# Patient Record
Sex: Male | Born: 1990 | Race: Black or African American | Hispanic: No | Marital: Single | Smoking: Never smoker
Health system: Southern US, Community
[De-identification: ages and names within clinical notes are randomized; demographics above are authoritative.]

## PROBLEM LIST (undated history)

## (undated) DIAGNOSIS — F909 Attention-deficit hyperactivity disorder, unspecified type: Secondary | ICD-10-CM

## (undated) HISTORY — PX: ANKLE SURGERY: SHX546

## (undated) HISTORY — PX: OTHER SURGICAL HISTORY: SHX169

## (undated) HISTORY — DX: Attention-deficit hyperactivity disorder, unspecified type: F90.9

---
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
## 2009-03-22 ENCOUNTER — Emergency Department (HOSPITAL_COMMUNITY): Admission: EM | Admit: 2009-03-22 | Discharge: 2009-03-22 | Payer: Self-pay | Admitting: Family Medicine

## 2009-12-22 ENCOUNTER — Emergency Department (HOSPITAL_COMMUNITY)
Admission: EM | Admit: 2009-12-22 | Discharge: 2009-12-22 | Payer: Self-pay | Source: Home / Self Care | Admitting: Emergency Medicine

## 2010-01-11 ENCOUNTER — Encounter: Admission: RE | Admit: 2010-01-11 | Discharge: 2010-01-11 | Payer: Self-pay | Admitting: Internal Medicine

## 2010-03-18 ENCOUNTER — Emergency Department (HOSPITAL_COMMUNITY)
Admission: EM | Admit: 2010-03-18 | Discharge: 2010-03-19 | Disposition: A | Discharge status: Home / Self Care | Payer: BC Managed Care – PPO | Attending: Emergency Medicine | Admitting: Emergency Medicine

## 2010-03-18 DIAGNOSIS — F909 Attention-deficit hyperactivity disorder, unspecified type: Secondary | ICD-10-CM | POA: Insufficient documentation

## 2010-03-18 DIAGNOSIS — R059 Cough, unspecified: Secondary | ICD-10-CM | POA: Insufficient documentation

## 2010-03-18 DIAGNOSIS — J40 Bronchitis, not specified as acute or chronic: Secondary | ICD-10-CM | POA: Insufficient documentation

## 2010-03-18 DIAGNOSIS — J45909 Unspecified asthma, uncomplicated: Secondary | ICD-10-CM | POA: Insufficient documentation

## 2010-03-18 DIAGNOSIS — R05 Cough: Secondary | ICD-10-CM | POA: Insufficient documentation

## 2010-03-18 DIAGNOSIS — J3489 Other specified disorders of nose and nasal sinuses: Secondary | ICD-10-CM | POA: Insufficient documentation

## 2010-03-18 DIAGNOSIS — R079 Chest pain, unspecified: Secondary | ICD-10-CM | POA: Insufficient documentation

## 2010-03-19 ENCOUNTER — Emergency Department (HOSPITAL_COMMUNITY): Payer: BC Managed Care – PPO

## 2010-05-21 LAB — POCT HEMOGLOBIN-HEMACUE: Hemoglobin: 15.4 g/dL (ref 13.0–17.0)

## 2010-06-27 NOTE — Op Note (Signed)
Danny Robbins, Danny Robbins         ACCOUNT NO.:  [id number]   MEDICAL RECORD NO.:  [id number]          PATIENT TYPE:  AMB   LOCATION:  DSC                          FACILITY:  [facility]   PHYSICIAN:  Lubertha Basque. Dalldorf, M.D.DATE OF BIRTH:  11/09/1990   DATE OF PROCEDURE:  10/28/2007  DATE OF DISCHARGE:                               OPERATIVE REPORT   PREOPERATIVE DIAGNOSES:  1. Right anterior cruciate ligament tear.  2. Right torn lateral meniscus.  3. Right osteochondral injury.   POSTOPERATIVE DIAGNOSES:  1. Right anterior cruciate ligament tear.  2. Right torn lateral meniscus.  3. Right osteochondral injury.   PROCEDURES:  1. Right knee anterior cruciate ligament reconstruction.  2. Right knee microfracture, lateral femoral condyle.  3. Right knee partial lateral meniscectomy.  4. Right knee arthroscopic chondroplasty, patellofemoral.   ANESTHESIA:  General and block.   ATTENDING SURGEON:  Lubertha Basque. 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  He suffered ACL, MCL injuries as well as osteochondral injury, and torn  meniscus based on exam and MRI.  We braced him for 3 weeks to allow for  some of the MCL to heal.  This has been accomplished and he is now  offered ACL reconstruction and other associated procedures in hopes of  stabilizing his knee and allowing him to get back to sports such as  football.  Informed operative consent was obtained after discussion of  possible complications of reaction to anesthesia, infection, and knee  stiffness.  The patient also understood about the importance of the  postoperative rehabilitation protocol to optimize his result.   SUMMARY/FINDINGS AND PROCEDURE:  Under general anesthesia and a block,  an arthroscopy of the right knee was performed.  Suprapatellar pouch was  benign while he had some  mild chondromalacia patella, addressed with a  brief chondroplasty.  The patella appeared to track well.  The medial  compartment exhibited no evidence of meniscal or articular cartilage  injury.  The ACL was completely torn while the underlying PCL was  intact.  Lateral compartment exhibited a small radial meniscal tear  addressed with a brief partial lateral meniscectomy removing just a tiny  portion of this structure.  More significance was a quarter-sized area  of bare bone on the lateral femoral condyle, which came into  articulation at full extension.  This was consistent with the  osteochondral injury seen on MRI.  I performed a chondroplasty and then  microfracture in six spots with awls to create punctate bleeding in  hopes of fibrocartilage forming.  We then reconstructed the ACL with  middle third patellar tendon autograft, stabilized at both ends with  metal Linvatec screws.  Lindwood Qua assisted throughout and was  invaluable to the completion of the case in that he helped position and  retract while I performed the procedure.  He also fashioned the  autograft on the back table while I performed arthroscopic portions of  the case thereby significantly minimizing OR time.  DESCRIPTION OF THE PROCEDURE:  The patient was taken to operating suite  where general anesthetic was applied without difficulty.  He was also  given a block in the preanesthesia area.  He was Positioned supine,  prepped and draped in normal sterile fashion.  After administration of  IV Kefzol, an arthroscopy of the right knee was performed through a  total of 2 portals.  Findings were as noted above.  Procedures;  arthroscopy consisted of the chondroplasty and microfracture of lateral  along with a partial lateral meniscectomy.  I also performed a  chondroplasty, patellofemoral.  We then performed a notchplasty in a  conservative fashion with a bur, but did visualize over-the-top position  well.  I  removed his ACL stump and a portion of the fat pad.  The  underlying PCL was intact.  Arthroscopic equipment was then removed  temporarily for graft harvest.  We exsanguinated the leg and inflated  the tourniquet about the thigh.  A longitudinal medial incision was made  with dissection down through the peritenon to expose the patellar  tendon.  The middle third of the structure was harvested with contiguous  bone plugs from the patella and tibial tubercle.  This was done with  oscillating saw and scissors.  This tendon was not seen completely  normal and was very thickened consistent perhaps with chronic tendinosis  or scarring.  Nevertheless, it seemed to give [place] a very thick graft.  The soft tissue was actually thicker then the bone plugs.  This was then  fashioned on the back table by Lindwood Qua to assist through 9 and  10-mm tunnels with a drill hole placed in each of the bone plug.  A wire  was placed in one bone plug and a PDS suture in the other.  Arthroscopic  limb was reintroduced while graft was being prepared.  I placed a guide  anterior to the PCL and utilized this to place a guidewire up into the  knee into this position.  I overreamed to a diameter of 11 mm.  A  transtibial guide was placed in the over-the-top position and a separate  guidewire was placed out the proximal thigh.  I used this guidewire to  ream the distal femur to a depth of 2-1/2 cm in diameter of 10 mm with a  1 or 2 mm posterior wall well-visualized.  Bony debris was removed from  the knee with a shaver.  The aforementioned graft was then pulled into  position with some moderate difficulty.  This seated fully.  We were  careful to keep the tendinous aspect of the graft and a posterior  direction as I entered the femoral tunnel.  I placed the guidewire in an  anterior position in this tunnel and over this placed an 8 x 25 metal  Linvatec screw in interference fashion to secure the leading bone plug.   I then ranged the knee and the graft was felt to be isometric.  Easily  came to full extension.  I placed a second guidewire up into the knee,  seem to enter arthroscopically through the tibial tunnel.  Over this, I  placed a 7 x 25 Linvatec interference screw.  Again, the knee ranged  full and the graft was taut throughout.  Arthroscopic equipment was  removed.  Some bony trimmings were placed into the patellar defect as  well as the tibial tubercle defect.  The peritenon was reapproximated  with 0 Vicryl in a running fashion.  The tourniquet was deflated and a  small amount of bleeding was easily controlled with Bovie cautery.  Subcutaneous tissues reapproximated with 2-0 undyed Vicryl and skin was  closed with nylon.  Adaptic was applied followed by dry gauze and loose  Ace wrap.  Estimated blood loss and intraoperative fluids were obtained  from anesthesia records as can accurate tourniquet time.   DISPOSITION:  The patient was extubated in the operating room and was  taken to recovery room in stable addition.  He was to be admitted for  pain control.  We will probably discharge home in the morning.      Lubertha Basque Jerl Santos, M.D.  Electronically Signed     PGD/MEDQ  D:  10/28/2007  T:  10/29/2007  Job:  [id number]

## 2010-06-27 NOTE — Op Note (Signed)
Danny Robbins, Danny Robbins         ACCOUNT NO.:  [id number]   MEDICAL RECORD NO.:  [id number]          PATIENT TYPE:  AMB   LOCATION:  DSC                          FACILITY:  [facility]   PHYSICIAN:  Lubertha Basque. Dalldorf, M.D.DATE OF BIRTH:  20-Apr-1990   DATE OF PROCEDURE:  09/07/2008  DATE OF DISCHARGE:                               OPERATIVE REPORT   PREOPERATIVE DIAGNOSIS:  Right knee torn lateral meniscus.   POSTOPERATIVE DIAGNOSIS:  Right knee torn lateral meniscus.   PROCEDURE:  Right knee partial lateral meniscectomy.   ANESTHESIA:  General.   ATTENDING SURGEON:  Lubertha Basque. 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  He is about 10 months from an ACL  reconstruction.  He was doing well until about 2 months back when he  developed some lateral aspect pain and started to lose some of his  strength and mobility.  By exam and MRI, he has a small lateral meniscus  tear, which seems to be holding him back.  He is offered an arthroscopy  at this point to address the meniscal injury.  Informed operative  consent was obtained after discussion of possible complications  including reaction to anesthesia and infection.   SUMMARY OF FINDINGS AND PROCEDURE:  Under general anesthesia, a right  knee arthroscopy was performed.  He had a nice tight knee during exam  under anesthesia.  The suprapatellar pouch was benign as was the  patellofemoral joint.  There was some mild grade 3 breakdown medial  addressed with a brief chondroplasty.  The medial compartment otherwise  showed an intact meniscus with minimal degenerative changes.  We did  find some intraarticular cartilagenou loose bodies, which were removed.  The ACL appeared intact and normal for a reconstruction.  In the lateral  compartment the area where we had performed microfracture, had covered  nicely with  fibrocartilage.  He had again some cartilaginous loose  bodies, which I removed.  He had a small radial tear of the middle horn  of the lateral meniscus consistent with his MRI and a small partial  lateral meniscectomy was done leaving him with healthy portion of his  middle and posterior horn.   DESCRIPTION OF PROCEDURE:  The patient was taken to operating suite  where general anesthetic was applied without difficulty.  He was  positioned supine and prepped and draped in normal sterile fashion.  After the administration of preop IV Kefzol, an arthroscopy of the right  knee was performed through a total of 2 old portals.  Findings were as  noted above and procedure consisted of the partial lateral meniscectomy  done with a small shaver.  We also removed the cartilaginous loose  bodies, which likely was causing a good degree of his trouble.  The knee  was thoroughly irrigated followed by placement of Adaptic over the  portals and dry gauze with a loose Ace wrap.  Estimated blood loss and  intraoperative fluids can be obtained from the anesthesia records.   DISPOSITION:  The patient was extubated in the operating room and taken  to recovery room in stable addition.  He is to go home same-day and  follow up in the office next week.  I will contact him by phone tonight.      Lubertha Basque Jerl Santos, M.D.  Electronically Signed     PGD/MEDQ  D:  09/07/2008  T:  09/07/2008  Job:  [id number]

## 2010-06-30 NOTE — Op Note (Signed)
NAMEJAQUANN, GUARISCO                   ACCOUNT NO.:  [id number]   MEDICAL RECORD NO.:  [id number]                   PATIENT TYPE:  AMB   LOCATION:  DSC                                  FACILITY:  [facility]   PHYSICIAN:  Harvie Junior, M.D.                DATE OF BIRTH:  11-22-1990   DATE OF PROCEDURE:  12/14/2002  DATE OF DISCHARGE:                                 OPERATIVE REPORT   PREOPERATIVE DIAGNOSIS:  Fracture of medial malleolus.   POSTOPERATIVE DIAGNOSIS:  Fracture of medial malleolus.   PROCEDURE:  A closed reduction and fixation of medial malleolar fracture  with cannulated screw fixation.   SURGEON:  Harvie Junior, M.D.   ASSISTANT:  Marshia Ly, P.A.   ANESTHESIA:  General.   INDICATIONS FOR PROCEDURE:  This 20 year old male with a long history of  having a skateboarding-type injury where he fell off a skateboard and  suffered a medial malleolar fracture.  He went to Urgent Care and we were  consulted for treatment of this injury.  X-rays showed that he had a large  medial malleolar fracture with what looked to be a shear component and  looked to be vertically unstable.  We talked about treatment options,  including a closed treatment and monitoring, versus what I thought would be  the more appropriate course of action, which would be some sort of fixation  to prevent vertical migration of the medial malleolus fracture.  The family  ultimately thought about options, and wanted to undergo an open reduction,  internal fixation.  He was brought to the operating room for this procedure.   DESCRIPTION OF PROCEDURE:  The patient is brought to the operating room, and  after adequate anesthesia was obtained the patient was placed on the  operating table.  The left leg was prepped and draped in the usual sterile  fashion.  Following this the leg was exsanguinated and the blood pressure  tourniquet was inflated to 300 mmHg.  Following this fluoroscopy machine was  used.  Rigorous range of motion of the ankle revealed that there was some  tendency toward widening in the fracture gap, but no tendency towards  vertical migration.  At that point it was felt that percutaneous fixation  may be amenable, as opposed to open reduction and internal fixation, and  this was undertaken.  At this point the guide wires were passed across the  fracture fragment.  Three cannulated screws were placed, one perpendicular  to the fracture and the other two as resisting vertical migration.  At this  point the screws were measured and put in place.  Fl uroscopy was used  throughout the case to ensure adequate screw length and placement.  At this  point, once the screws were all placed, fluoroscopy was again used to make  sure that there was excellent fixation.  The range of motion of the ankle  showed no tendency towards subluxation  at this point.  The wounds were  irrigated and suctioned dry.  They were closed with interrupted sutures.  A  sterile compressive dressing was applied, as well as the use of a posterior  splint. The patient was taken to the recovery room, and noted to be in  satisfactory condition.  The estimated blood loss for the procedure was  none.                                               Harvie Junior, M.D.   Ranae Plumber  D:  12/14/2002  T:  12/14/2002  Job:  [id number]

## 2011-05-12 IMAGING — CT CT NECK W/ CM
4 of 5 series · 16 of 33 positions shown, 19 images · IV contrast (75CC OMNI 300)
Comparison: Cervical CT 06/14/2005.

CLINICAL DATA: 19-year-old male with "knot felt on physical exam"
that the patient is not able to localize.  Query lymphadenopathy.

CT NECK WITH CONTRAST
TECHNIQUE: Multidetector CT imaging of the neck was performed with
intravenous contrast.
Contrast: 75 ml Kmnipaque-R99.

[Series 3: axial neck · axial · 0.38mm/px · z∈[+58,+168]mm · 3 of 111 slices shown]
[im 23/111  bone]
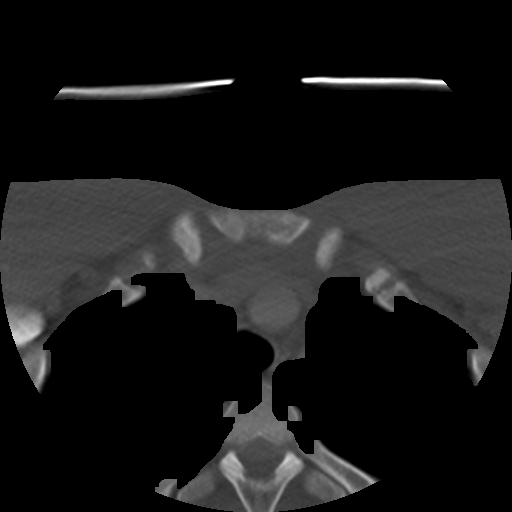
[im 45/111  bone]
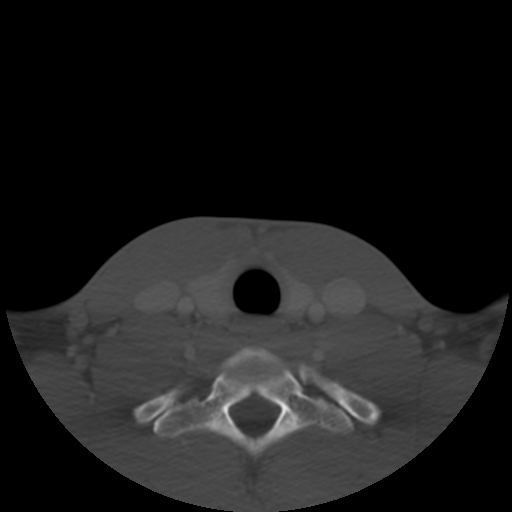
[im 67/111  bone]
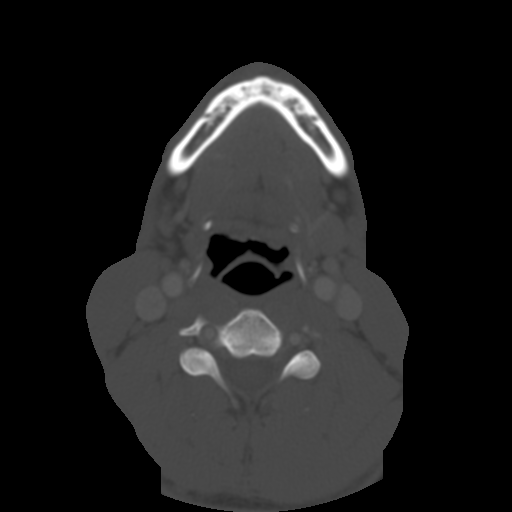

[Series 200: cor · coronal · 0.55mm/px · 3 of 94 slices shown]
[im 19/94  bone]
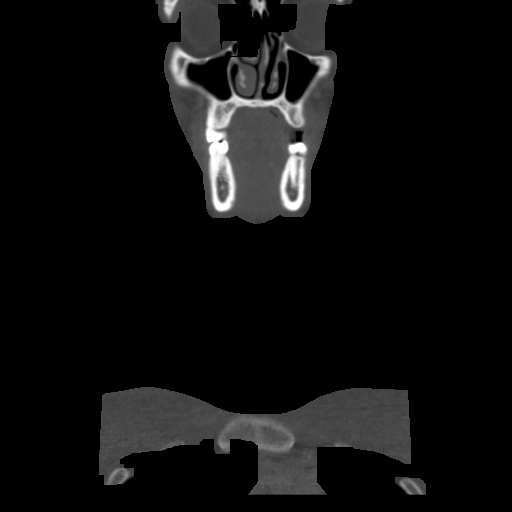
[im 38/94  bone]
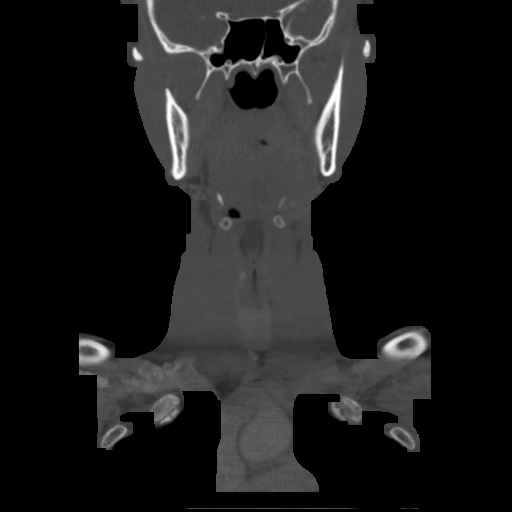
[im 56/94  bone]
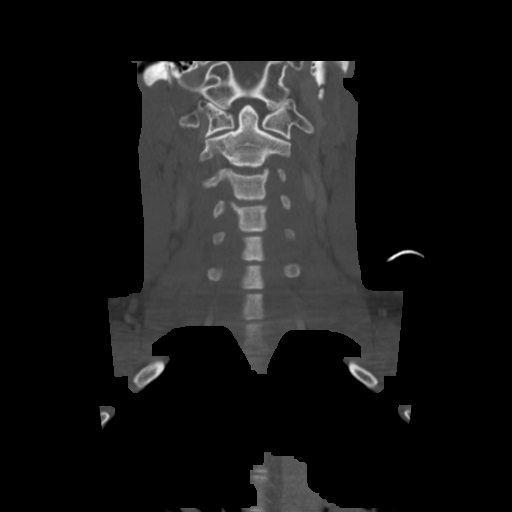

[Series 202: sag · sagittal · 0.55mm/px · 5 of 97 slices shown, 6 images]
[im 33/97  bone]
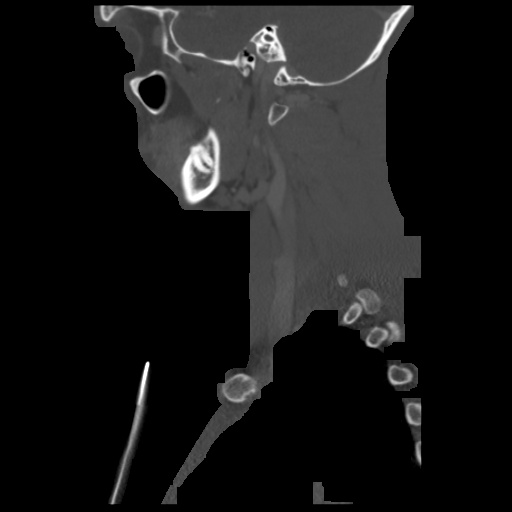
[im 41/97  bone]
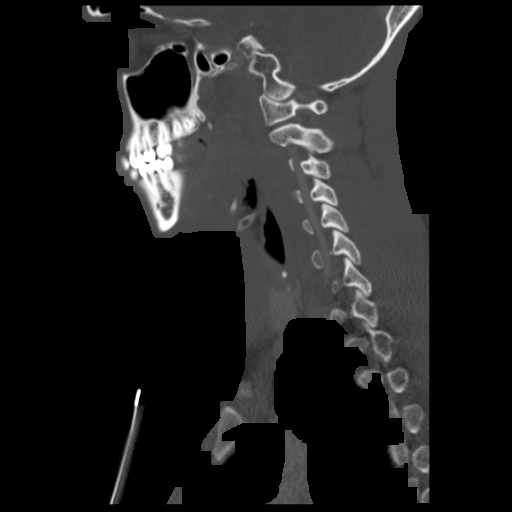
[im 49/97  soft-tissue]
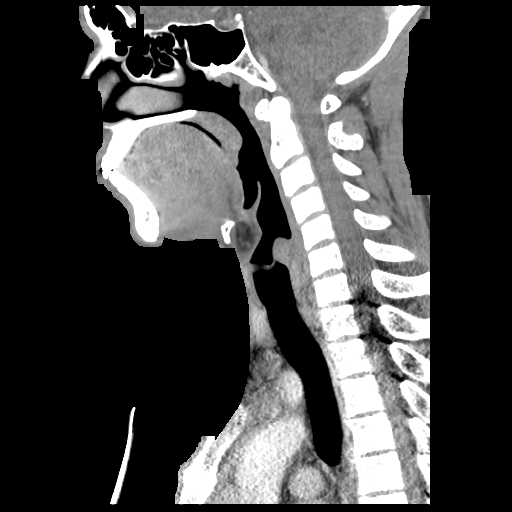
[im 49/97  bone]
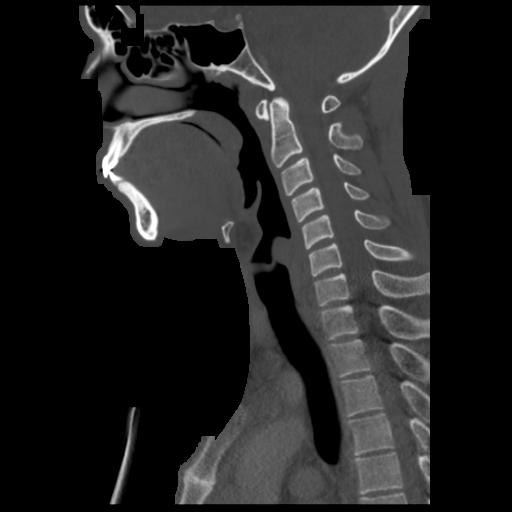
[im 57/97  bone]
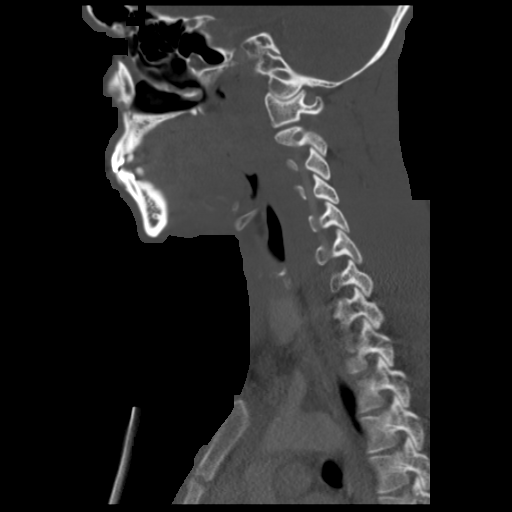
[im 65/97  bone]
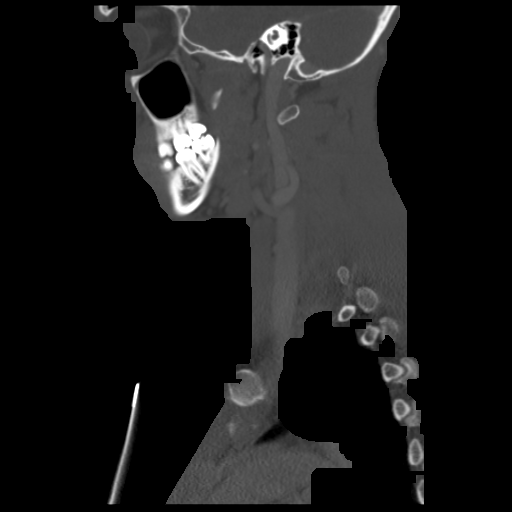

[Series 203: angled axial · axial · 0.39mm/px · z∈[+25,+200]mm · 5 of 136 slices shown, 7 images]
[im 23/136  soft-tissue]
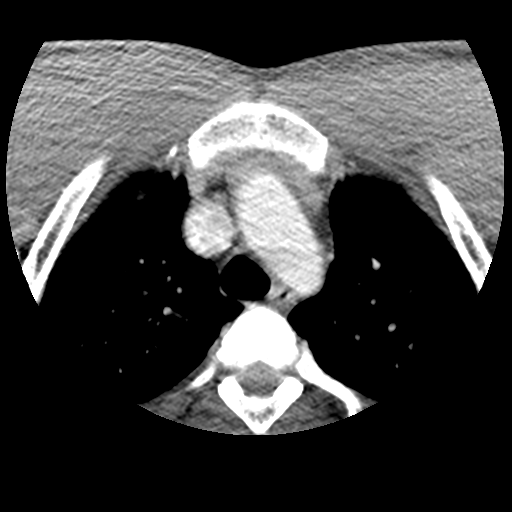
[im 23/136  bone]
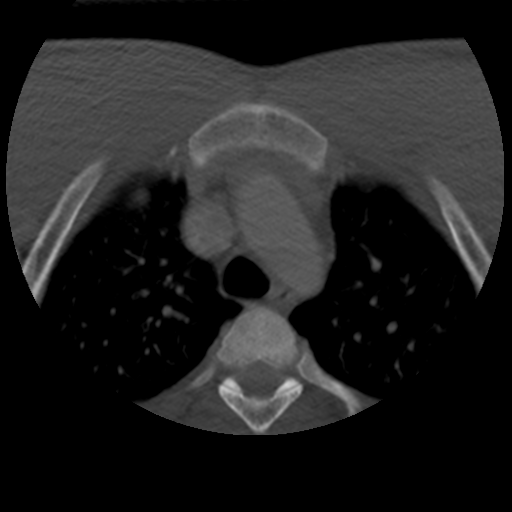
[im 46/136  bone]
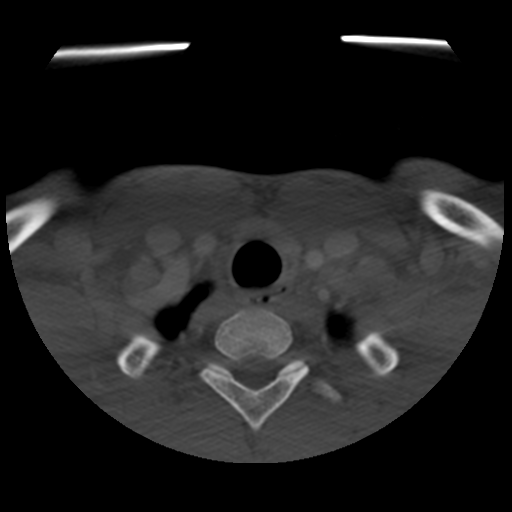
[im 68/136  bone]
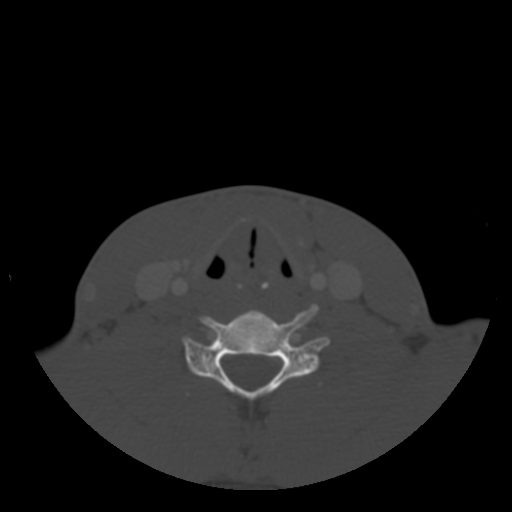
[im 91/136  bone]
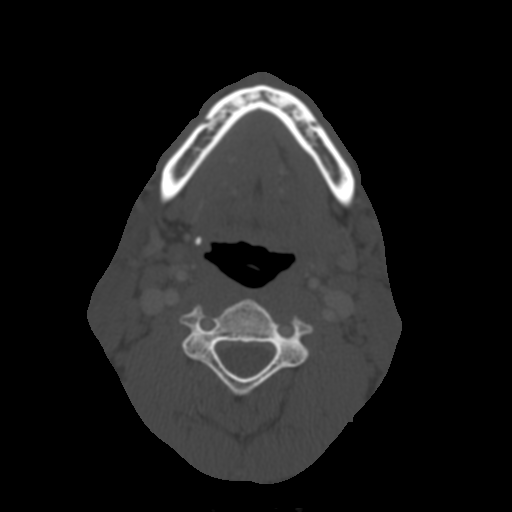
[im 113/136  soft-tissue]
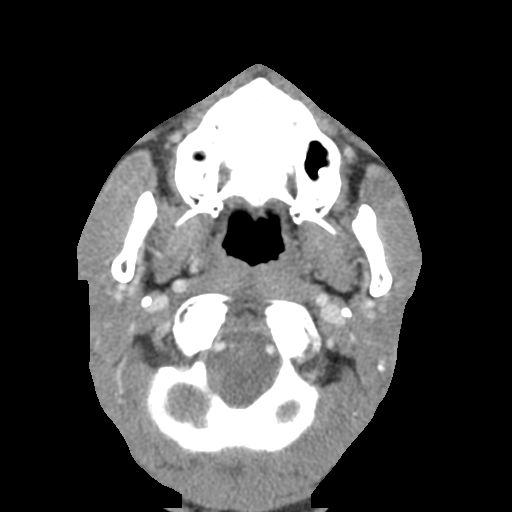
[im 113/136  bone]
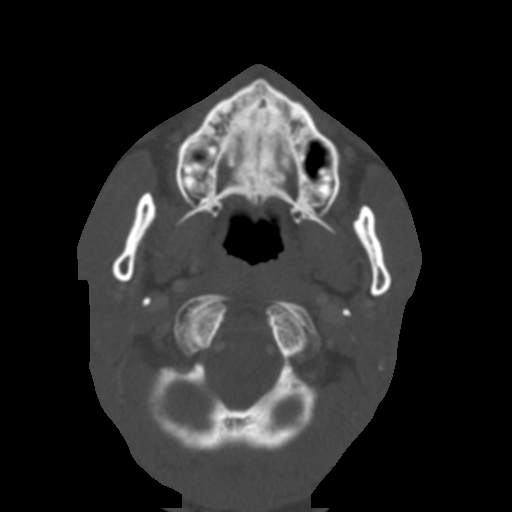

[16 of 33 positions shown; findings below may reference images not displayed]

FINDINGS: The patient has little subcutaneous fat and muscular
neck.  There are scattered bilateral cervical lymph nodes, the
largest of which are at the level IIA stations measuring up to 9 mm
in short axis on the right and 7 mm on the left.

Interestingly, the right submandibular gland is not identified and
there is mild soft tissue architectural distortion in the right
submandibular space.

Otherwise the pharyngeal mucosal spaces and the thyroid are within
normal limits.  Parapharyngeal, retropharyngeal and sublingual
spaces are within normal limits.  Submandibular and parotid glands
are within normal limits. No sialolithiasis.

Visualized brain parenchyma, orbits, paranasal sinuses, mastoids,
major vascular structures, visualized mediastinum, and visualized
lung apices are within normal limits. No acute osseous abnormality
identified.
IMPRESSION: There is generalized non-enlarged cervical lymphadenopathy which is
favored to be physiologic given this patient's age.  If
lymphadenopathy clinically increases, consider follow up imaging to
exclude the less likely possibility of a lymphoproliferative
disorder.
No suspicious right neck mass is identified, although the right
submandibular gland  cannot be identified.  Stranding or scarring
in the right submandibular space is present and might reflect
sequelae of prior traumatic or surgical injury/resection of the
gland. Clinical correlation recommended.

## 2011-07-18 IMAGING — CR DG CHEST 2V
2 series · 2 of 2 positions shown · non-contrast
Comparison: None.

CLINICAL DATA: Shortness of breath with wheezing.

CHEST - 2 VIEW

[w chest pa]
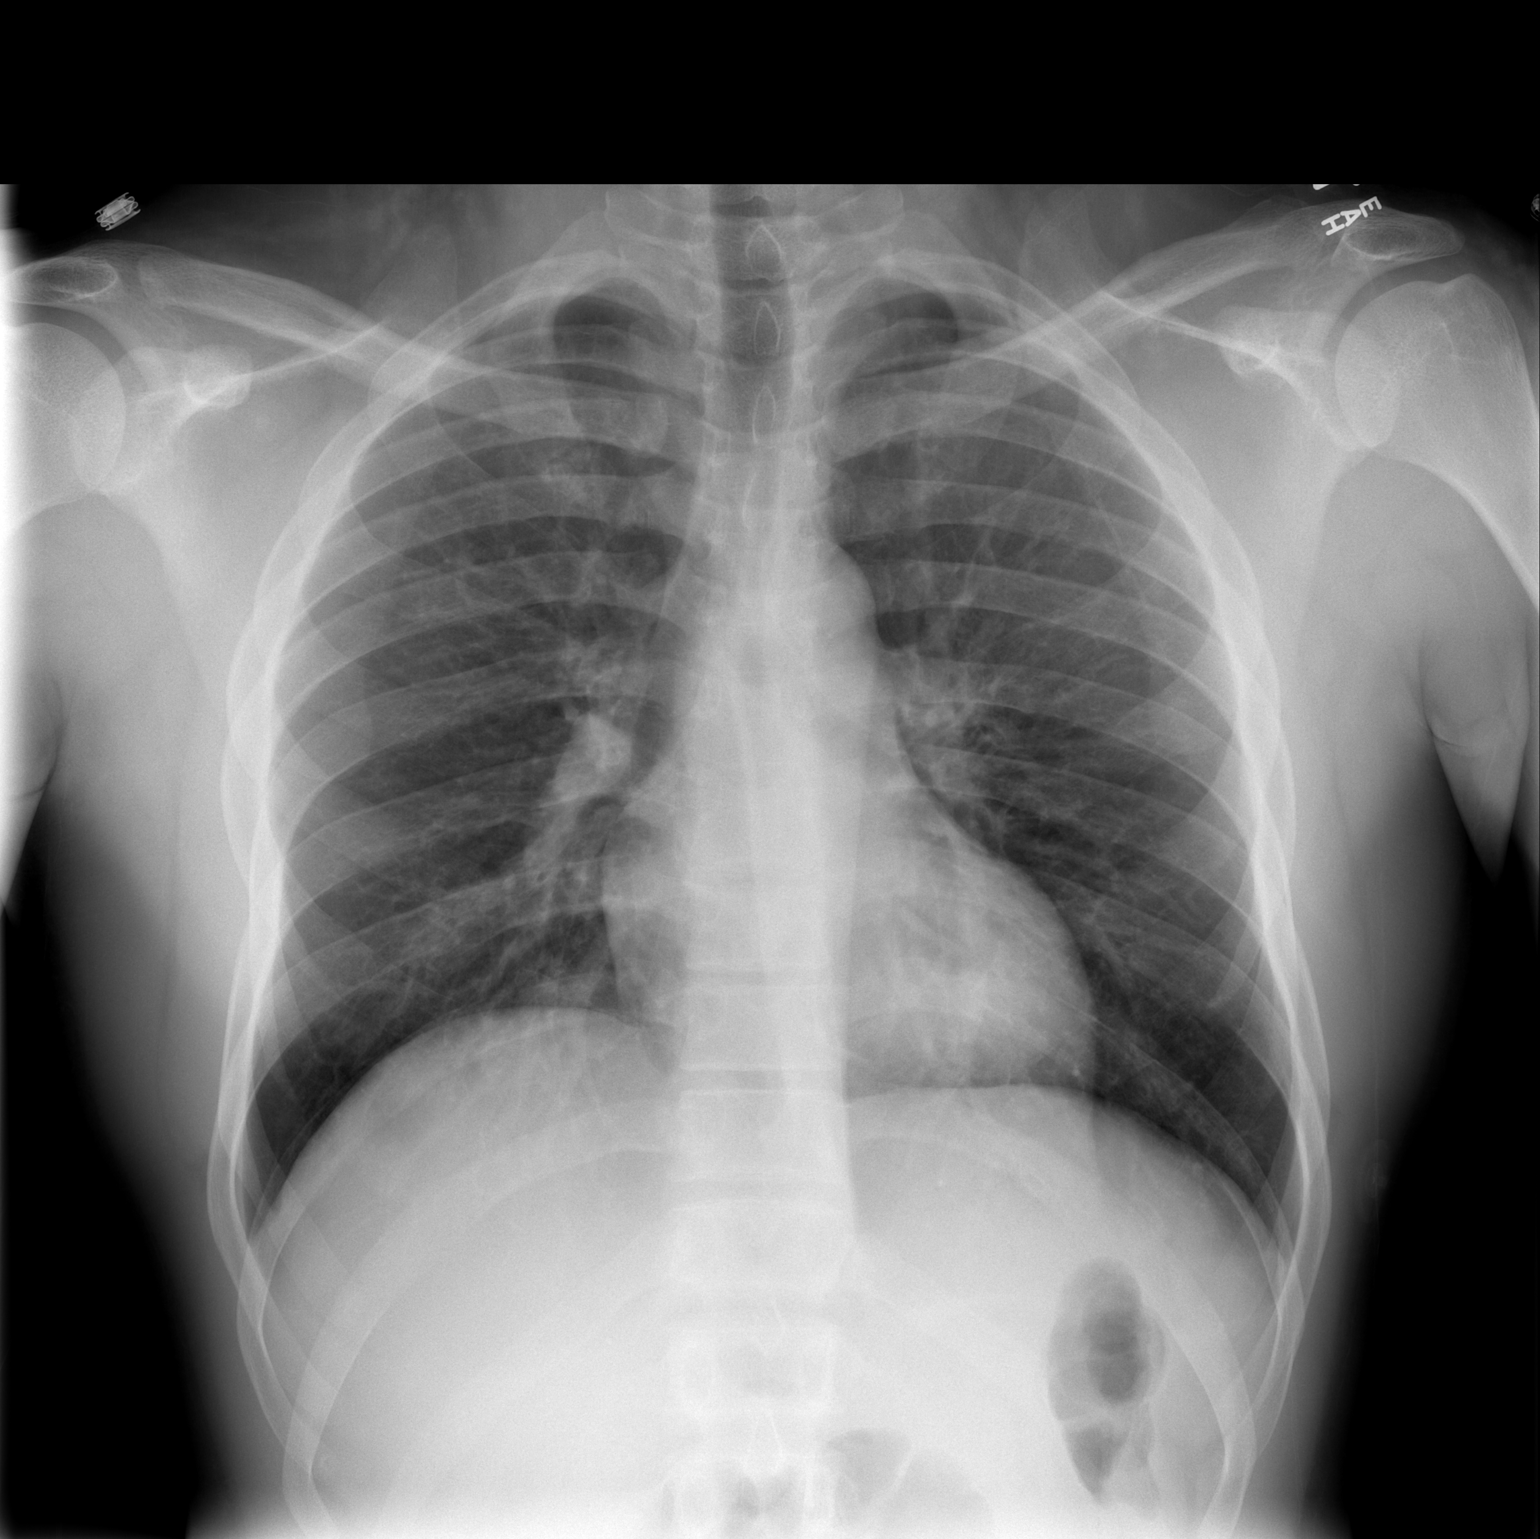

[w chest lat]
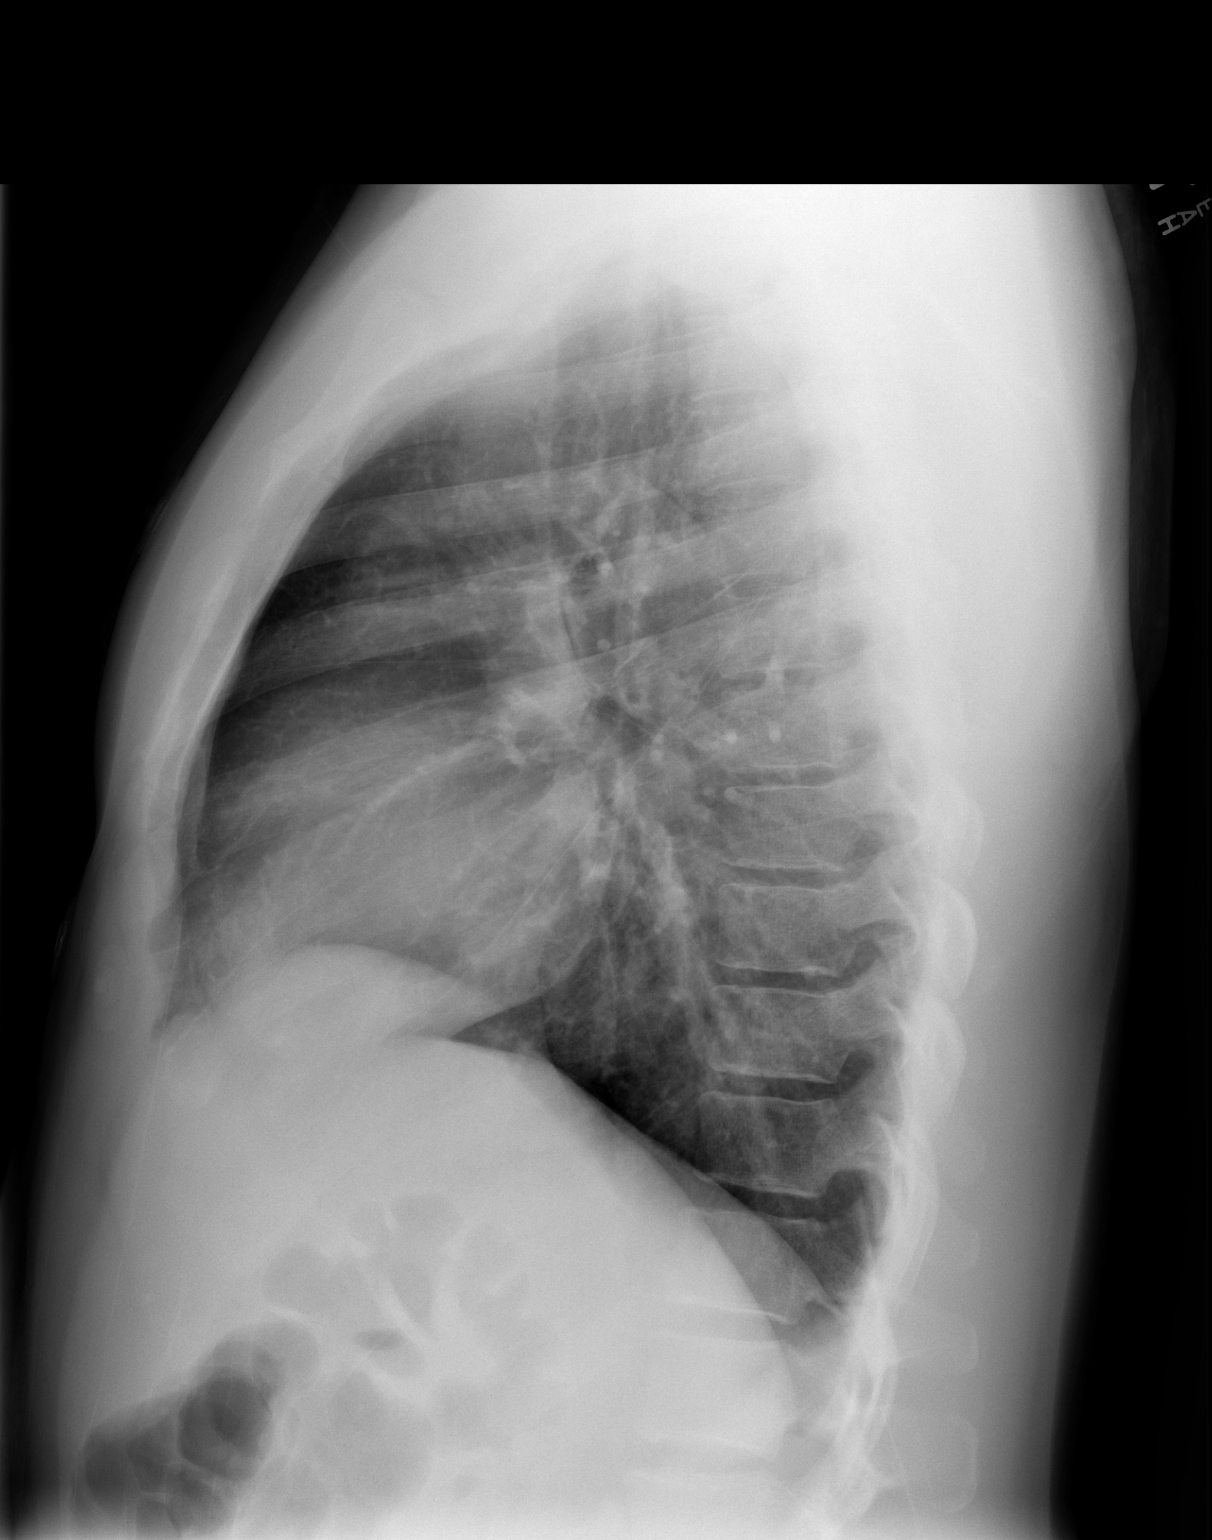

[2 of 2 positions shown; findings below may reference images not displayed]

FINDINGS: Mild increased perihilar markings suggest either viral
pneumonitis or reactive airways disease.  Mild hyperinflation No
infiltrates or pulmonary edema.  No effusion or pneumothorax.
Bones unremarkable.  Normal heart size and mediastinal contours.
IMPRESSION: Mild increased perihilar markings suggest reactive airways disease
or viral pneumonitis.

## 2012-12-06 ENCOUNTER — Emergency Department (INDEPENDENT_AMBULATORY_CARE_PROVIDER_SITE_OTHER)
Admission: EM | Admit: 2012-12-06 | Discharge: 2012-12-06 | Disposition: A | Discharge status: Home / Self Care | Payer: 59 | Source: Home / Self Care | Attending: Emergency Medicine | Admitting: Emergency Medicine

## 2012-12-06 ENCOUNTER — Encounter (HOSPITAL_COMMUNITY): Payer: Self-pay | Admitting: Emergency Medicine

## 2012-12-06 DIAGNOSIS — S99911A Unspecified injury of right ankle, initial encounter: Secondary | ICD-10-CM

## 2012-12-06 DIAGNOSIS — R58 Hemorrhage, not elsewhere classified: Secondary | ICD-10-CM

## 2012-12-06 DIAGNOSIS — S8990XA Unspecified injury of unspecified lower leg, initial encounter: Secondary | ICD-10-CM

## 2012-12-06 DIAGNOSIS — S99919A Unspecified injury of unspecified ankle, initial encounter: Secondary | ICD-10-CM

## 2012-12-06 NOTE — ED Notes (Signed)
Pt has a small puncture wound to right ankle/medial malleolus onset 1 hour ago Denies: inj/trauma... Reports he was showering when he noticed blood gushing out Upon arrival, he had a bandana wrapped around his ankle... Milus Glazier was taken off and pt resumed gushing blood out.  Ambulated well to exam room w/NAD... Alert w/no signs of acute distress.

## 2012-12-06 NOTE — ED Provider Notes (Signed)
CSN: [id number]     Arrival date & time 12/06/12  1758 History   First MD Initiated Contact with Patient 12/06/12 1816     Chief Complaint  Patient presents with  . Puncture Wound   (Consider location/radiation/quality/duration/timing/severity/associated sxs/prior Treatment) HPI Comments: 22 year old male presents complaining of right ankle bleeding since one hour ago on the medial side of his right ankle. He was in the shower when he looked down and noticed blood gushing out of the side of his foot. He he denies any injury and has no idea how this could have happened. This was approximately one hour prior to arrival. He has been controlling the bleeding with direct pressure, but every time he moves up and then off his ankle blood starts gushing out again. He does not know how much blood loss but he feels fine, no dizziness, chest pain, shortness of breath, or nausea. No previous history of similar incidences. No history of leg swelling, venous stasis, or peripheral arterial disease she does note that his foot is starting to feel tingly but he believes this is because he has held it in the same position for so long so that he can hold pressure.Marland Kitchen    History reviewed. No pertinent past medical history. History reviewed. No pertinent past surgical history. No family history on file. History  Substance Use Topics  . Smoking status: Never Smoker   . Smokeless tobacco: Not on file  . Alcohol Use: Yes    Review of Systems  Constitutional: Negative for fever, chills and fatigue.  HENT: Negative for sore throat.   Eyes: Negative for visual disturbance.  Respiratory: Negative for cough and shortness of breath.   Cardiovascular: Negative for chest pain, palpitations and leg swelling.  Gastrointestinal: Negative for nausea, vomiting, abdominal pain, diarrhea and constipation.  Genitourinary: Negative for dysuria, urgency, frequency and hematuria.  Musculoskeletal: Negative for arthralgias,  myalgias, neck pain and neck stiffness.  Skin: Positive for wound. Negative for rash.  Neurological: Negative for dizziness, weakness and light-headedness.    Allergies  Review of patient's allergies indicates no known allergies.  Home Medications   Current Outpatient Rx  Name  Route  Sig  Dispense  Refill  . amphetamine-dextroamphetamine (ADDERALL XR) 20 MG 24 hr capsule   Oral   Take 20 mg by mouth every morning.          BP 124/65  Pulse 107  Temp(Src) 98.1 F (36.7 C) (Oral)  Resp 16  SpO2 100% Physical Exam  Nursing note and vitals reviewed. Constitutional: He is oriented to person, place, and time. He appears well-developed and well-nourished. No distress.  HENT:  Head: Normocephalic.  Pulmonary/Chest: Effort normal. No respiratory distress.  Musculoskeletal:       Feet:  Neurological: He is alert and oriented to person, place, and time. Coordination normal.  Skin: Skin is warm and dry. No rash noted. He is not diaphoretic.  Psychiatric: He has a normal mood and affect. Judgment normal.    ED Course  Procedures (including critical care time) Labs Review Labs Reviewed - No data to display Imaging Review No results found.  Direct pressure held on this for approximately one hour and 15 minutes without any cessation of bleeding. Area cleaned with Betadine and anesthetized with 2% lidocaine with epinephrine. Given the chance for epinephrine to help with the bleeding, this also did not stop the bleeding. One figure-of-eight suture with #5 Prolene proximal to the wound did not stop the bleeding. One figure-of-eight suture with #  5 Prolene distal to this did cease all bleeding. Pulses are strongly palpable and capillary refill is intact. Have patient wait half an hour, no further bleeding. Pulse is still strong, capillary Refill still good. Check pulses in the foot with a Doppler ultrasound, pulses are strong at both the dorsalis pedis and posterior tibialis. The tingling  sensation has completely resolved as well  MDM   1. Bleeding of blood vessel   2. Ankle injury, right, initial encounter    Patient advised to give a very close eye on this and return immediately here or to the emergency department for any further bleeding, numbness of the foot, lateral coloration to the foot, darkening of the foot, cold feeling in the foot, swelling of the foot, or basically any sensation other than this just getting better. We will have him followup with vascular surgery. Advised ice, elevation, limit physical activity for a couple of days.  Graylon Good, PA-C 12/07/12 [phone]

## 2012-12-08 ENCOUNTER — Encounter: Payer: Self-pay | Admitting: Vascular Surgery

## 2012-12-08 NOTE — ED Provider Notes (Signed)
Medical screening examination/treatment/procedure(s) were performed by non-physician practitioner and as supervising physician I was immediately available for consultation/collaboration.  Breta Demedeiros, M.D.  Lucile Didonato C Malayzia Laforte, MD 12/08/12 0801 

## 2012-12-09 ENCOUNTER — Other Ambulatory Visit: Payer: Self-pay | Admitting: *Deleted

## 2012-12-09 ENCOUNTER — Ambulatory Visit (INDEPENDENT_AMBULATORY_CARE_PROVIDER_SITE_OTHER): Payer: 59 | Admitting: Vascular Surgery

## 2012-12-09 ENCOUNTER — Ambulatory Visit (HOSPITAL_COMMUNITY)
Admission: RE | Admit: 2012-12-09 | Discharge: 2012-12-09 | Disposition: A | Discharge status: Home / Self Care | Payer: 59 | Source: Ambulatory Visit | Attending: Vascular Surgery | Admitting: Vascular Surgery

## 2012-12-09 ENCOUNTER — Encounter: Payer: Self-pay | Admitting: Vascular Surgery

## 2012-12-09 VITALS — BP 133/77 | HR 94 | Resp 16 | Ht 69.0 in | Wt 207.0 lb

## 2012-12-09 DIAGNOSIS — R233 Spontaneous ecchymoses: Secondary | ICD-10-CM | POA: Insufficient documentation

## 2012-12-09 DIAGNOSIS — I83893 Varicose veins of bilateral lower extremities with other complications: Secondary | ICD-10-CM

## 2012-12-09 DIAGNOSIS — M79609 Pain in unspecified limb: Secondary | ICD-10-CM | POA: Insufficient documentation

## 2012-12-09 NOTE — Progress Notes (Signed)
Subjective:     Patient ID: Danny Robbins, male   DOB: 07-14-1990, 22 y.o.   MRN: [id number]  HPI this 22 year old male was referred from urgent care Center where he was seen 3 days ago. Also having in the shower he had a spontaneous onset of brisk bleeding from his right ankle along the medial aspect near the medial malleolus. He has no previous history of varicose veins, spontaneous bleeding, ulceration, DVT, thrombophlebitis. He denies unilateral edema in his lower extremities. It is not wear elastic compression stockings. 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 The bleeding required 2 Prolene sutures to control he has had no recurrent bleeding since that time.  History reviewed. No pertinent past medical history.  History  Substance Use Topics  . Smoking status: Never Smoker   . Smokeless tobacco: Not on file  . Alcohol Use: 3.6 oz/week    6 Cans of beer per week    Family History  Problem Relation Age of Onset  . Hypertension Father     No Known Allergies  Current outpatient prescriptions:amphetamine-dextroamphetamine (ADDERALL XR) 20 MG 24 hr capsule, Take 20 mg by mouth every morning., Disp: , Rfl: ;  clonazePAM (KLONOPIN) 1 MG tablet, , Disp: , Rfl:   BP 133/77  Pulse 94  Resp 16  Ht 5\' 9"  (1.753 m)  Wt 207 lb (93.895 kg)  BMI 30.55 kg/m2  Body mass index is 30.55 kg/(m^2).           Review of Systems entirely negative complete review of systems in this healthy gentleman     Objective:   Physical Exam BP 133/77  Pulse 94  Resp 16  Ht 5\' 9"  (1.753 m)  Wt 207 lb (93.895 kg)  BMI 30.55 kg/m2  Gen.-alert and oriented x3 in no apparent distress HEENT normal for age Lungs no rhonchi or wheezing Cardiovascular regular rhythm no murmurs carotid pulses 3+ palpable no bruits audible Abdomen soft nontender no palpable masses Musculoskeletal free of  major deformities Skin clear -no rashes Neurologic normal Lower extremities 3+  femoral and dorsalis pedis pulses palpable bilaterally with no edema Right ankle area of closely examined. 2 Prolene sutures placed over the medial malleolus where the bleeding site was located. No evidence of any ulceration or reticular or spider or varicose veins in the right lower extremity. There is no edema or hyperpigmentation. No uni lateral edema is noted comparing right to left.  Today I ordered a venous duplex exam of the right leg which are reviewed and interpreted. There is some deep venous reflux. There is no superficial reflux of significance in the saphenous vein is normal size of the great saphenous and small saphenous. There is a small perforator in the mid calf area of the great saphenous vein.       Assessment:     History of spontaneous bleeding from right ankle-no evidence of significant venous insufficiency on reflux study or physical exam    Plan:     No specific treatment indicated Would have sutures removed in about one week in urgent care Center If further spontaneous bleeding occurs advised patient to get in Trendelenburg position and apply pressure If develops evidence of varicose veins or venous insufficiency he will be back in touch with [place]

## 2012-12-16 ENCOUNTER — Emergency Department (INDEPENDENT_AMBULATORY_CARE_PROVIDER_SITE_OTHER)
Admission: EM | Admit: 2012-12-16 | Discharge: 2012-12-16 | Disposition: A | Discharge status: Home / Self Care | Payer: 59 | Source: Home / Self Care

## 2012-12-16 ENCOUNTER — Encounter (HOSPITAL_COMMUNITY): Payer: Self-pay | Admitting: Emergency Medicine

## 2012-12-16 DIAGNOSIS — Z4802 Encounter for removal of sutures: Secondary | ICD-10-CM

## 2012-12-16 MED ORDER — TETANUS-DIPHTH-ACELL PERTUSSIS 5-2.5-18.5 LF-MCG/0.5 IM SUSP
INTRAMUSCULAR | Status: AC
  Filled 2012-12-16: qty 0.5

## 2012-12-16 MED ORDER — TETANUS-DIPHTH-ACELL PERTUSSIS 5-2.5-18.5 LF-MCG/0.5 IM SUSP
0.5000 mL | Freq: Once | INTRAMUSCULAR | Status: AC
  Administered 2012-12-16: 0.5 mL via INTRAMUSCULAR

## 2012-12-16 NOTE — ED Provider Notes (Signed)
Medical screening examination/treatment/procedure(s) were performed by resident physician or non-physician practitioner and as supervising physician I was immediately available for consultation/collaboration.   Shemica Meath DOUGLAS MD.   Kutter Schnepf D Shaine Newmark, MD 12/16/12 1509 

## 2012-12-16 NOTE — ED Provider Notes (Signed)
CSN: [id number]     Arrival date & time 12/16/12  1229 History   First MD Initiated Contact with Patient 12/16/12 1247     Chief Complaint  Patient presents with  . Follow-up   (Consider location/radiation/quality/duration/timing/severity/associated sxs/prior Treatment) HPI Comments: For suture removal    History reviewed. No pertinent past medical history. Past Surgical History  Procedure Laterality Date  . Orthopedic surgies     Family History  Problem Relation Age of Onset  . Hypertension Father    History  Substance Use Topics  . Smoking status: Never Smoker   . Smokeless tobacco: Not on file  . Alcohol Use: 3.6 oz/week    6 Cans of beer per week    Review of Systems  All other systems reviewed and are negative.    Allergies  Review of patient's allergies indicates no known allergies.  Home Medications   Current Outpatient Rx  Name  Route  Sig  Dispense  Refill  . amphetamine-dextroamphetamine (ADDERALL XR) 20 MG 24 hr capsule   Oral   Take 20 mg by mouth every morning.         . clonazePAM (KLONOPIN) 1 MG tablet                There were no vitals taken for this visit. Physical Exam  ED Course  SUTURE REMOVAL Date/Time: 12/16/2012 1:35 PM Performed by: Phineas Real, Balraj Brayfield Authorized by: Bradd Canary D Consent: Verbal consent obtained. Risks and benefits: risks, benefits and alternatives were discussed Consent given by: patient Body area: lower extremity Location details: right ankle Wound Appearance: clean Sutures Removed: 2 Post-removal: dressing applied Facility: sutures placed in this facility Patient tolerance: Patient tolerated the procedure well with no immediate complications.   (including critical care time) Labs Review Labs Reviewed - No data to display Imaging Review No results found.    MDM   1. Visit for suture removal      Tdap Sutures removed   Hayden Rasmussen, NP 12/16/12 1336

## 2012-12-16 NOTE — ED Notes (Signed)
Pt is here for a f/u and to have stitches removed Reports he saw the vascular surgeon and "everything was ok" Voices no new concerns other than wanting a tdap shot Alert w/no signs of acute distress... Ambulated to exam room w/NAD.

## 2015-06-15 ENCOUNTER — Encounter: Payer: Self-pay | Admitting: Gastroenterology

## 2015-08-15 ENCOUNTER — Encounter: Payer: Self-pay | Admitting: Gastroenterology

## 2015-08-15 ENCOUNTER — Ambulatory Visit (INDEPENDENT_AMBULATORY_CARE_PROVIDER_SITE_OTHER): Payer: BLUE CROSS/BLUE SHIELD | Admitting: Gastroenterology

## 2015-08-15 ENCOUNTER — Other Ambulatory Visit (INDEPENDENT_AMBULATORY_CARE_PROVIDER_SITE_OTHER): Payer: BLUE CROSS/BLUE SHIELD

## 2015-08-15 VITALS — BP 120/86 | HR 64 | Ht 69.0 in | Wt 218.2 lb

## 2015-08-15 DIAGNOSIS — R197 Diarrhea, unspecified: Secondary | ICD-10-CM | POA: Diagnosis not present

## 2015-08-15 DIAGNOSIS — R140 Abdominal distension (gaseous): Secondary | ICD-10-CM | POA: Diagnosis not present

## 2015-08-15 LAB — IGA: IGA: 144 mg/dL (ref 68–378)

## 2015-08-15 NOTE — Patient Instructions (Signed)
You will have labs checked today in the basement lab.  Please head down after you check out with the front desk  (tTG, total IgA level). If this is positive you will need EGD for biopsies. If this is negative you will need further labs, stool testing and possibly colonoscopy.

## 2015-08-15 NOTE — Progress Notes (Signed)
HPI: This is a    very pleasant 25 year old man    who was referred to me by Dr. Farris HasAaron Morrow for bloating, vomiting.  Chief complaint is bloating, loose stools, intermittent vomiting  Has trouble with bloating.   Certain foods cause problems  Noted a change in his bowels 2-3 months ago:  Since a very young kid he'd have 2-3 BMs daily.  But in past 2-3 months looser stools, very gassy, going4-5 times per day.  Has had urgency in night, loose stools.  No bleeding.  Weight fluctuating.  His sister diagnosed with celiac sprue;  Vomits at times.   Review of systems: Pertinent positive and negative review of systems were noted in the above HPI section. Complete review of systems was performed and was otherwise normal.   History reviewed. No pertinent past medical history.  Past Surgical History  Procedure Laterality Date  . Orthopedic surgies      Current Outpatient Prescriptions  Medication Sig Dispense Refill  . amphetamine-dextroamphetamine (ADDERALL XR) 20 MG 24 hr capsule Take 20 mg by mouth every morning.    . clonazePAM (KLONOPIN) 1 MG tablet      No current facility-administered medications for this visit.    Allergies as of 08/15/2015  . (No Known Allergies)    Family History  Problem Relation Age of Onset  . Hypertension Father   . Celiac disease Sister     Social History   Social History  . Marital Status: Single    Spouse Name: N/A  . Number of Children: 2  . Years of Education: N/A   Occupational History  . Coach    Social History Main Topics  . Smoking status: Never Smoker   . Smokeless tobacco: Never Used  . Alcohol Use: 3.6 oz/week    6 Cans of beer per week  . Drug Use: No  . Sexual Activity: Not on file   Other Topics Concern  . Not on file   Social History Narrative     Physical Exam: BP 120/86 mmHg  Pulse 64  Ht 5\' 9"  (1.753 m)  Wt 218 lb 3.2 oz (98.975 kg)  BMI 32.21 kg/m2 Constitutional: generally  well-appearing Psychiatric: alert and oriented x3 Eyes: extraocular movements intact Mouth: oral pharynx moist, no lesions Neck: supple no lymphadenopathy Cardiovascular: heart regular rate and rhythm Lungs: clear to auscultation bilaterally Abdomen: soft, nontender, nondistended, no obvious ascites, no peritoneal signs, normal bowel sounds Extremities: no lower extremity edema bilaterally Skin: no lesions on visible extremities   Assessment and plan: 25 y.o. male with  bloating, loose stools, intermittent vomiting over the past 2-3 months  He is African-American and so is his sister however he tells me she was just diagnosed with celiac sprue and has had to change her kitchen, eating habits dramatically. I cleaned them it is not very common for black skinned people to have celiac sprue but also not unheard of. His symptoms certainly could be consistent with celiac sprue and I recommended we do some very targeted testing with blood work screening for celiac sprue. If this is positive I will recommend upper endoscopy for biopsies of the small intestine. If it is negative then he would need further blood work and stool testing and possibly colonoscopy.   Rob Buntinganiel Jamesyn Lindell, MD [facility] 08/15/2015, 10:21 AM   Cc: Dr. Kateri PlummerMorrow.

## 2015-08-16 LAB — TISSUE TRANSGLUTAMINASE, IGA: TISSUE TRANSGLUTAMINASE AB, IGA: 1 U/mL (ref <4)

## 2015-08-19 ENCOUNTER — Telehealth: Payer: Self-pay | Admitting: Gastroenterology

## 2015-08-19 DIAGNOSIS — R197 Diarrhea, unspecified: Secondary | ICD-10-CM

## 2015-08-19 NOTE — Telephone Encounter (Signed)
See result note on 08/15/15 for documentation.

## 2015-11-24 ENCOUNTER — Encounter: Payer: Self-pay | Admitting: Allergy & Immunology

## 2015-11-24 ENCOUNTER — Encounter (INDEPENDENT_AMBULATORY_CARE_PROVIDER_SITE_OTHER): Payer: Self-pay

## 2015-11-24 ENCOUNTER — Ambulatory Visit (INDEPENDENT_AMBULATORY_CARE_PROVIDER_SITE_OTHER): Payer: BLUE CROSS/BLUE SHIELD | Admitting: Allergy & Immunology

## 2015-11-24 VITALS — BP 110/70 | HR 80 | Temp 98.8°F | Resp 16 | Ht 69.0 in | Wt 213.2 lb

## 2015-11-24 DIAGNOSIS — T781XXD Other adverse food reactions, not elsewhere classified, subsequent encounter: Secondary | ICD-10-CM

## 2015-11-24 DIAGNOSIS — J310 Chronic rhinitis: Secondary | ICD-10-CM | POA: Diagnosis not present

## 2015-11-24 MED ORDER — FLUTICASONE PROPIONATE 50 MCG/ACT NA SUSP: 1.0000 | Freq: Every day | NASAL | 5 refills | Status: AC

## 2015-11-24 MED ORDER — EPINEPHRINE 0.3 MG/0.3ML IJ SOAJ: 0.3000 mg | Freq: Once | INTRAMUSCULAR | 1 refills | Status: AC

## 2015-11-24 NOTE — Progress Notes (Signed)
NEW PATIENT  Date of Service/Encounter:  11/24/15   Assessment:   Adverse food reaction, subsequent encounter  Other chronic rhinitis    Plan/Recommendations:   1. Adverse food reactions - Testing today showed: positives to wheat and cow's milk - You should avoid wheat and cow's milk. - If you tolerate baked milk, you should continue to eat that.  - You will need to read all labels. - FARE information provided. - EpiPen training and prescription provided. - We will follow over time with retesting every 2-3 years.  2. Chronic rhinitis - Testing today showed: positives to weeds, trees, grasses, molds, cat, dust mite - Avoidance measures.  - Consider allergy shots. - Call your insurance to check on coverage.  - You would require two vials (two shots per visit). - Start Flonase one spray per nostril daily. - Use cetirizine 68m (Zyrtec) or fexofenadine 1855m(Allegra) as needed for breakthrough symptoms.  3. Return in about 2 months (around 01/24/2016).   Subjective:   Danny MASELLIs a [age].o. male presenting today for evaluation of  Chief Complaint  Patient presents with  . Food Intolerance    pt has been having issues with dairy and grains. Pt will get bloated and uncomfortable and some times vomit and gasy.  with bananas pt will get a itchy throat.   . Danny Robbins a history of the following: Patient Active Problem List   Diagnosis Date Noted  . Varicose veins of lower extremities with other complications 1014/23/9532  History obtained from: chart review and patient.  ChNicola Robbins referred by Danny Robbins.     Danny Robbins a [age].o. male presenting for concern for food allergies. He is concerned today with food allergies. He becomes bloated and vomits with wheat and dairy. He becomes very bloated and uncomfortable with severe and abdominal pain. He has to have a bowel movement on a more regular basis, but not  diarrhea necessarily. He is not having any blood or mucous in the stools. These symptoms were first noted around three months ago. He is fairly conscious with what he eats but there are still certain foods that result in these symptoms. He has never completely rid his diet of gluten. He has had no itching or hives. He has had some episodes of "hot flashes". He does not eat ice cream often but the last time that he did, he reports that he was "jacked up". He can tolerate certain cheeses but does not eat it much. He does not drink much milk and used whey as a protein source but has abdominal pain with it. He does eat eggs. There is little peanut or tree nut ingestion due to his son have allergies. He does eat fish and shellfish. He does tolerate soy. Danny Robbins has a history of throat itching with bananas. It has never gone beyond his mouth. He has never had an EpiPen. He does fine with avocados. He does have a similar reaction to watermelon.   As part of this workup, he did go to GI where he was tested for Celiac (sister has this) but that was negative. They did not perform an endoscopy at that time. GI doctor was Dr. DaOwens LofflerHe has only been one time and is waiting to return until [place].   ChAlcidesoes have a history of seasonal allergies. These are constant throughout the year. He has problems on grass  fields. He was using Xyzal last, but he has used local honey as a treatment as well. He did use nose sprays but overall he uses things as needed. He does have fairly constant dripping down the back of his throat. He does some intermittent congestion. He does have sneezing and itchy watery eyes. The time course is somewhat hard to grasp, but it appears that he has symptoms throughout the entire year.  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 He tolerates working out without a problem. He does have a history  of ADHD, depression, and anxiety and is on a few medications for this. Otherwise, there is no history of other atopic diseases, including asthma, drug allergies, stinging insect allergies, or urticaria. There is no significant infectious history. Vaccinations are up to date.    Past Medical History: Patient Active Problem List   Diagnosis Date Noted  . Varicose veins of lower extremities with other complications 44/02/270    Medication List:    Medication List       Accurate as of 11/24/15 10:28 AM. Always use your most recent med list.          amphetamine-dextroamphetamine 20 MG 24 hr capsule Commonly known as:  ADDERALL XR Take 20 mg by mouth every morning.   clonazePAM 1 MG tablet Commonly known as:  KLONOPIN   sertraline 50 MG tablet Commonly known as:  ZOLOFT Take 50 mg by mouth daily.       Birth History: non-contributory. Born at term without complications.   Developmental History: Danny Robbins has met all milestones on time. He has required no speech therapy, occupational therapy, or physical therapy.   Past Surgical History: Past Surgical History:  Procedure Laterality Date  . ANKLE SURGERY    . orthopedic surgies (right ankle, thumb, right knee)    . ucl       Family History: Family History  Problem Relation Age of Onset  . Hypertension Father   . Celiac disease Sister    2.5 yo son has peanut, cow's milk, egg, and tree nut allergy. Youngest son (1.5yo) is in good health. Sister was diagnosed Celiac disease. There are multiple family members with seasonal allergies.    Social History: Danny Robbins lives at home with his wife and two boys. There is no smoking. They live in an apartment with carpeting throughout. They have electric heating and central cooling. There is one fish in the home as well as dog outside. They do have dust mite covers for the bed, but not the pillows. There is no tobacco exposure. 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   Review of Systems: a 14-point review of systems is pertinent for what is mentioned in HPI.  Otherwise, all other systems were negative. Constitutional: negative other than that listed in the HPI Eyes: negative other than that listed in the HPI Ears, nose, mouth, throat, and face: negative other than that listed in the HPI Respiratory: negative other than that listed in the HPI Cardiovascular: negative other than that listed in the HPI Gastrointestinal: negative other than that listed in the HPI Genitourinary: negative other than that listed in the HPI Integument: negative other than that listed in the HPI Hematologic: negative other than that listed in the HPI Musculoskeletal: negative other than that listed in the HPI Neurological: negative other than that listed  in the HPI Allergy/Immunologic: negative other than that listed in the HPI    Objective:   Blood pressure 110/70, pulse 80, temperature 98.8 F (37.1 C), temperature source Oral, resp. rate 16, height 5' 9"  (1.753 m), weight 213 lb 3.2 oz (96.7 kg). Body mass index is 31.48 kg/m.   Physical Exam:  General: Alert, interactive, in no acute distress. Fit male.  HEENT: TMs pearly gray, turbinates edematous and pale with clear discharge, post-pharynx erythematous. Neck: Supple without thyromegaly. Adenopathy: no enlarged lymph nodes appreciated in the anterior cervical, occipital, axillary, epitrochlear, inguinal, or popliteal regions Lungs: Clear to auscultation without wheezing, rhonchi or rales. No increased work of breathing. CV: Normal S1/S2, no murmurs. Capillary refill <2 seconds.  Abdomen: Nondistended, nontender. No guarding or rebound tenderness. Bowel sounds faint and present in all fields  Skin: Warm and dry, without lesions or rashes. Extremities:  No clubbing, cyanosis or edema. Neuro:   Grossly  intact.  Diagnostic studies:    Allergy Studies:   Indoor/Outdoor Percutaneous Adult Environmental Panel: positive to all grasses, nearly all weeds, all trees, many molds, cat  Indoor/Outdoor Selected Intradermal Environmental Panel: positive to dust mite, negative to dog  Most Common Foods Panel (peanut, tree nut, soy, fish mix, shellfish mix, wheat, milk, egg): positive to wheat and cow's milk  Selected Foods Panel (watermelon, banana): negative    Salvatore Marvel, MD FAAAAI [facility]

## 2015-11-24 NOTE — Patient Instructions (Addendum)
1. Adverse food reactions - Testing today showed: positives to wheat and cow's milk - You should avoid wheat and cow's milk. - If you tolerate baked milk, you should continue to eat that.  - You will need to read all labels. - FARE information provided. - EpiPen training and prescription provided. - We will follow over time with retesting every 2-3 years.  2. Chronic rhinitis - Testing today showed: positives to weeds, trees, grasses, molds, cat, dust mite - Avoidance measures as below.  - Consider allergy shots. - Call your insurance to check on coverage.  - You would require two vials (two shots per visit). - Start Flonase one spray per nostril daily. - Use cetirizine 10mg  (Zyrtec) or fexofenadine 180mg  (Allegra) as needed for breakthrough symptoms.  3. Return in about 2 months (around 01/24/2016).  Please inform us of any Emergency Department visits, hospitalizations, or changes in symptoms. Call us before going to the ED for breathing or allergy symptoms since we might be able to fit you in for a sick visit. Feel free to contact us anytime with any questions, problems, or concerns.  It was a pleasure to meet you today!   Websites that have reliable patient information: 1. American Academy of Asthma, Allergy, and Immunology: www.aaaai.org 2. Food Allergy Research and Education (FARE): foodallergy.org 3. Mothers of Asthmatics: http://www.asthmacommunitynetwork.org 4. American College of Allergy, Asthma, and Immunology: www.acaai.org  Control of Mold Allergen  Mold and fungi can grow on a variety of surfaces provided certain temperature and moisture conditions exist.  Outdoor molds grow on plants, decaying vegetation and soil.  The major outdoor mold, Alternaria and Cladosporium, are found in very high numbers during hot and dry conditions.  Generally, a late Summer - Fall peak is seen for common outdoor fungal spores.  Rain will temporarily lower outdoor mold spore count, but counts  rise rapidly when the rainy period ends.  The most important indoor molds are Aspergillus and Penicillium.  Dark, humid and poorly ventilated basements are ideal sites for mold growth.  The next most common sites of mold growth are the bathroom and the kitchen.  Outdoor [employer] 1. Use air conditioning and keep windows closed 2. Avoid exposure to decaying vegetation. 3. Avoid leaf raking. 4. Avoid grain handling. 5. Consider wearing a face mask if working in moldy areas.  Indoor Mold Control 1. Maintain humidity below 50%. 2. Clean washable surfaces with 5% bleach solution. 3. Remove sources e.g. contaminated carpets.  Reducing Pollen Exposure  The American Academy of Allergy, Asthma and Immunology suggests the following steps to reduce your exposure to pollen during allergy seasons.    1. Do not hang sheets or clothing out to dry; pollen may collect on these items. 2. Do not mow lawns or spend time around freshly cut grass; mowing stirs up pollen. 3. Keep windows closed at night.  Keep car windows closed while driving. 4. Minimize morning activities outdoors, a time when pollen counts are usually at their highest. 5. Stay indoors as much as possible when pollen counts or humidity is high and on windy days when pollen tends to remain in the air longer. 6. Use air conditioning when possible.  Many air conditioners have filters that trap the pollen spores. 7. Use a HEPA room air filter to remove pollen form the indoor air you breathe.  Control of Dog or Cat Allergen  Avoidance is the best way to manage a dog or cat allergy. If you have a dog or cat  and are allergic to dog or cats, consider removing the dog or cat from the home. If you have a dog or cat but don't want to find it a new home, or if your family wants a pet even though someone in the household is allergic, here are some strategies that may help keep symptoms at bay:  1. Keep the pet out of your bedroom and restrict it to  only a few rooms. Be advised that keeping the dog or cat in only one room will not limit the allergens to that room. 2. Don't pet, hug or kiss the dog or cat; if you do, wash your hands with soap and water. 3. High-efficiency particulate air (HEPA) cleaners run continuously in a bedroom or living room can reduce allergen levels over time. 4. Regular use of a high-efficiency vacuum cleaner or a central vacuum can reduce allergen levels. 5. Giving your dog or cat a bath at least once a week can reduce airborne allergen.  Control of House Dust Mite Allergen    House dust mites play a major role in allergic asthma and rhinitis.  They occur in environments with high humidity wherever human skin, the food for dust mites is found. High levels have been detected in dust obtained from mattresses, pillows, carpets, upholstered furniture, bed covers, clothes and soft toys.  The principal allergen of the house dust mite is found in its feces.  A gram of dust may contain 1,000 mites and 250,000 fecal particles.  Mite antigen is easily measured in the air during house cleaning activities.    1. Encase mattresses, including the box spring, and pillow, in an air tight cover.  Seal the zipper end of the encased mattresses with wide adhesive tape. 2. Wash the bedding in water of 130 degrees Farenheit weekly.  Avoid cotton comforters/quilts and flannel bedding: the most ideal bed covering is the dacron comforter. 3. Remove all upholstered furniture from the bedroom. 4. Remove carpets, carpet padding, rugs, and non-washable window drapes from the bedroom.  Wash drapes weekly or use plastic window coverings. 5. Remove all non-washable stuffed toys from the bedroom.  Wash stuffed toys weekly. 6. Have the room cleaned frequently with a vacuum cleaner and a damp dust-mop.  The patient should not be in a room which is being cleaned and should wait 1 hour after cleaning before going into the room. 7. Close and seal all  heating outlets in the bedroom.  Otherwise, the room will become filled with dust-laden air.  An electric heater can be used to heat the room. 8. Reduce indoor humidity to less than 50%.  Do not use a humidifier.

## 2015-11-25 ENCOUNTER — Other Ambulatory Visit: Payer: Self-pay

## 2015-11-25 MED ORDER — EPINEPHRINE 0.3 MG/0.3ML IJ SOAJ: 0.3000 mg | Freq: Once | INTRAMUSCULAR | 1 refills | Status: AC

## 2015-12-13 ENCOUNTER — Telehealth: Payer: Self-pay | Admitting: *Deleted

## 2015-12-13 MED ORDER — EPINEPHRINE 0.3 MG/0.3ML IJ SOAJ: 0.3000 mg | Freq: Once | INTRAMUSCULAR | 4 refills | Status: AC

## 2015-12-13 NOTE — Telephone Encounter (Signed)
Patient's insurance will not cover EpiPen. Can we call in Auvi-Q. Please advise.

## 2015-12-13 NOTE — Telephone Encounter (Signed)
Audry RilesuviQ works for me.  Thanks, Malachi BondsJoel Gallagher, MD FAAAAI [facility] [place] [place]

## 2015-12-13 NOTE — Addendum Note (Signed)
Addended by: Kathrine HaddockWOOD, Brittanie Dosanjh L on: 12/13/2015 04:28 PM   Modules accepted: Orders

## 2015-12-13 NOTE — Telephone Encounter (Signed)
Auvi-Q sent to [facility]
